# Patient Record
Sex: Male | Born: 1990 | Race: White | Hispanic: No | Marital: Single | Smoking: Current some day smoker
Health system: Southern US, Community
[De-identification: ages and names within clinical notes are randomized; demographics above are authoritative.]

---
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
## 2009-05-30 ENCOUNTER — Ambulatory Visit: Payer: Self-pay | Admitting: Family Medicine

## 2009-05-30 DIAGNOSIS — M546 Pain in thoracic spine: Secondary | ICD-10-CM | POA: Insufficient documentation

## 2010-04-21 NOTE — Assessment & Plan Note (Signed)
Summary: BACK PAIN//SLM   Vital Signs:  Patient profile:   20 year old male Height:      73 inches Weight:      199 pounds BMI:     26.35 Temp:     98.5 degrees F oral Pulse rate:   91 / minute BP sitting:   122 / 82  (left arm) Cuff size:   large  Vitals Entered By: Alfred Levins, CMA (May 30, 2009 4:03 PM) CC: mid left back pain onset today   History of Present Illness: 20 yr old male here for the onset today of stiffness and pain in the middle of the back . He woke up with this. No recent trauma, but he did spend last night on a couch. Ibuprofen helps a little.   Preventive Screening-Counseling & Management  Alcohol-Tobacco     Smoking Status: never  Caffeine-Diet-Exercise     Does Patient Exercise: yes      Drug Use:  marijuana and yes.    Allergies (verified): No Known Drug Allergies  Past History:  Past Medical History: Allergic rhinitis fractured right foot fractured nose  Past Surgical History: Denies surgical history  Family History: Reviewed history and no changes required. Family History Diabetes 1st degree relative  Social History: Reviewed history and no changes required. Single Never Smoked Alcohol use-no Drug use-yes Regular exercise-yes Smoking Status:  never Drug Use:  marijuana, yes Does Patient Exercise:  yes  Review of Systems  The patient denies anorexia, fever, weight loss, weight gain, vision loss, decreased hearing, hoarseness, chest pain, syncope, dyspnea on exertion, peripheral edema, prolonged cough, headaches, hemoptysis, abdominal pain, melena, hematochezia, severe indigestion/heartburn, hematuria, incontinence, genital sores, muscle weakness, suspicious skin lesions, transient blindness, difficulty walking, depression, unusual weight change, abnormal bleeding, enlarged lymph nodes, angioedema, breast masses, and testicular masses.    Physical Exam  General:  Well-developed,well-nourished,in no acute distress;  alert,appropriate and cooperative throughout examination Lungs:  Normal respiratory effort, chest expands symmetrically. Lungs are clear to auscultation, no crackles or wheezes. Heart:  Normal rate and regular rhythm. S1 and S2 normal without gallop, murmur, click, rub or other extra sounds. Msk:  tender along the left side of the back just under the scapula with some spasm. Full ROM   Impression & Recommendations:  Problem # 1:  BACK PAIN, THORACIC REGION (ICD-724.1)  His updated medication list for this problem includes:    Flexeril 10 Mg Tabs (Cyclobenzaprine hcl) .Marland Kitchen... Three times a day as needed spasm  Complete Medication List: 1)  Flexeril 10 Mg Tabs (Cyclobenzaprine hcl) .... Three times a day as needed spasm  Patient Instructions: 1)  This probably came from sleeping on the couch last night. It  should resolve in a few days. Try  heat, Ibuprofen, and Flexeril.  2)  Please schedule a follow-up appointment as needed .  Prescriptions: FLEXERIL 10 MG TABS (CYCLOBENZAPRINE HCL) three times a day as needed spasm  #60 x 2   Entered and Authorized by:   Nelwyn Salisbury MD   Signed by:   Nelwyn Salisbury MD on 05/30/2009   Method used:   Print then Give to Patient   RxID:   [id number]

## 2010-08-07 NOTE — Op Note (Signed)
Corey Harding, Corey Harding          ACCOUNT NO.:  [id number]   MEDICAL RECORD NO.:  [id number]          PATIENT TYPE:  AMB   LOCATION:  [facility]                          FACILITY:  [facility]   PHYSICIAN:  Earvin Hansen L. [place]es[place], M.D.DATE OF BIRTH:  [birth year]   DATE OF PROCEDURE:  07/13/2004  DATE OF DISCHARGE:                                 OPERATIVE REPORT   OPERATIVE REPORT:  This is a 20 year old who was playing basketball and  accidentally was hit in the nose with an elbow, causing severe depressed  right nasal fracture.  When examining the patient, it is obvious that the  right nasion is flattened down and pushed all the way over leftward from the  midline.  He is having difficulty breathing actually on both side.  Intranasal examination reveals the septum to be deviated all the way to the  left side with an airway of approximately 30% patency on the left.   PROCEDURES DONE:  Closed reduction of nasal and septal fractures.   ANESTHESIA:  General.   The patient was taken to the operating room, placed on the operating room  table in the supine position, was given adequate general anesthesia and  intubated orally.  The prep was done to the face with Betadine solution and  walled off with sterile towels and draped so as to make a sterile field.  The midline of the nose was drawn using a marking pen as well as demarcation  of the depressed fracture.  A 1.5% Xylocaine with epinephrine was injected  locally.  A total of 10 cc as well as 4% cocaine packs were placed, a total  of 4 cc.  After waiting an adequate amount of time for vasoconstriction to  take place, an Ash forceps was then used to do a closed reduction of the  nasal septal fracture with an audible pop as well as relocation into the  midline structures.  Also a butter knife was used to lift up the fracture a  little bit more on the right side.  The airways were then dilated with  graduating specula to ensure patency.  After this, the  airways were then  packed with Vaseline gauze, 0.5 inch Steri-Strips were placed and a nasal  splint and drip dressings.  He withstood the procedures very well, was taken  to recovery in excellent condition.      GLT/MEDQ  D:  07/13/2004  T:  07/13/2004  Job:  [id number]

## 2011-11-20 ENCOUNTER — Ambulatory Visit (INDEPENDENT_AMBULATORY_CARE_PROVIDER_SITE_OTHER): Payer: 59 | Admitting: Internal Medicine

## 2011-11-20 ENCOUNTER — Encounter: Payer: Self-pay | Admitting: Internal Medicine

## 2011-11-20 VITALS — BP 100/70 | HR 69 | Temp 97.4°F | Wt 206.0 lb

## 2011-11-20 DIAGNOSIS — R102 Pelvic and perineal pain: Secondary | ICD-10-CM | POA: Insufficient documentation

## 2011-11-20 DIAGNOSIS — R109 Unspecified abdominal pain: Secondary | ICD-10-CM

## 2011-11-20 MED ORDER — DOXYCYCLINE HYCLATE 100 MG PO TABS: 100.0000 mg | ORAL_TABLET | Freq: Two times a day (BID) | ORAL | Status: AC

## 2011-11-20 MED ORDER — CEFTRIAXONE SODIUM 500 MG IJ SOLR
500.0000 mg | Freq: Once | INTRAMUSCULAR | Status: AC
  Administered 2011-11-20: 500 mg via INTRAMUSCULAR

## 2011-11-20 NOTE — Progress Notes (Signed)
  Subjective:    Patient ID: Corey Harding, male    DOB: Jan 28, 1991, 21 y.o.   MRN: [id number]  HPI Recent onset of low back discomfort Has sense of cold and oversensitivity at head of penis Some persistent erection earlier this week---some better (not with stimulation)  No inflammation at glans No discharge  No fever Seen at urgent care at [facility] yesterday---did blood test  Last intercourse was 1 week ago Single sexual partner for 2 months---using condoms May have had 4 partners in the past year--always uses condoms  No current outpatient prescriptions on file prior to visit.    No Known Allergies  No past medical history on file.  No past surgical history on file.  No family history on file.  History   Social History  . Marital Status: Single    Spouse Name: N/A    Number of Children: N/A  . Years of Education: N/A   Occupational History  . Marketing internship     [employer]   Social History Main Topics  . Smoking status: Never Smoker   . Smokeless tobacco: Never Used  . Alcohol Use: Yes  . Drug Use: Yes    Special: Marijuana  . Sexually Active: Not on file   Other Topics Concern  . Not on file   Social History Narrative  . No narrative on file   Review of Systems Does have some urinary urgency but no dysuria Some rectal urgency yesterday. Good BM today Appetite is off some    Objective:   Physical Exam  Constitutional: He appears well-developed and well-nourished. No distress.  Abdominal: Soft. There is no tenderness.  Genitourinary:       No urethral inflammation or discharge No scrotal swelling or inflammation or tenderness Rectal exam shows non boggy prostate with mild tenderness          Assessment & Plan:

## 2011-11-20 NOTE — Assessment & Plan Note (Signed)
Most likely diagnosis is prostatitis He has had only protected sex but still need to consider STD Will treat with rocephin Doxycycline for 10 days

## 2012-10-02 ENCOUNTER — Ambulatory Visit (INDEPENDENT_AMBULATORY_CARE_PROVIDER_SITE_OTHER): Payer: 59 | Admitting: Family Medicine

## 2012-10-02 VITALS — BP 124/76 | HR 74 | Temp 97.6°F | Wt 211.0 lb

## 2012-10-02 DIAGNOSIS — Z113 Encounter for screening for infections with a predominantly sexual mode of transmission: Secondary | ICD-10-CM

## 2012-10-02 NOTE — Progress Notes (Signed)
  Subjective:    Patient ID: Corey Harding, male    DOB: 22-Feb-1991, 22 y.o.   MRN: [id number]  HPI Acute visit Patient seen with some prominent veins underneath penis off and on past couple months. Mostly noted following intercourse and after erections. He has had multiple sexual partners within the past year. Mostly barrier protection but not consistently Denies history of STD. No recent penile rashes. No discharge. No dysuria. No recent appetite or weight changes. He states he's had previous hepatitis B vaccination  No past medical history on file. No past surgical history on file.  reports that he has never smoked. He has never used smokeless tobacco. He reports that  drinks alcohol. He reports that he uses illicit drugs (Marijuana). family history is not on file. No Known Allergies     Review of Systems  Constitutional: Negative for fever and chills.  Respiratory: Negative for cough and shortness of breath.   Genitourinary: Negative for dysuria, hematuria, discharge and genital sores.  Skin: Negative for rash.  Hematological: Negative for adenopathy.       Objective:   Physical Exam  Constitutional: He appears well-developed and well-nourished.  Cardiovascular: Normal rate and regular rhythm.   Pulmonary/Chest: Effort normal and breath sounds normal. No respiratory distress. He has no wheezes. He has no rales.  Genitourinary: Penis normal.  No testicle mass. No hernia. Somewhat prominent veins underneath side of penis otherwise normal exam. No skin lesions  Skin: No rash noted.          Assessment & Plan:  #1 STD screening. We discussed the importance of barrier protection. Obtain HIV, RPR, and urine for GC and Chlamydia screen. Otherwise exam is normal. He has some minimally prominent veins underneath side of penis and reassurance is given

## 2012-10-03 LAB — GC/CHLAMYDIA PROBE AMP, URINE: GC Probe Amp, Urine: NEGATIVE

## 2012-10-03 LAB — HIV ANTIBODY (ROUTINE TESTING W REFLEX): HIV: NONREACTIVE

## 2015-02-24 ENCOUNTER — Telehealth: Payer: Self-pay | Admitting: Family Medicine

## 2015-02-24 NOTE — Telephone Encounter (Signed)
Pt's mother Bev Alfredia FergusonCleveland states pt needs to be seen for cpx within the next 2 weeks.  Requesting to have pt worked in.  Please advise if ok to work pt in for cpx within the next 2 weeks.

## 2015-02-25 NOTE — Telephone Encounter (Signed)
Yes we can work him in for a cpx  

## 2015-02-26 NOTE — Telephone Encounter (Signed)
Appt scheduled 02/28/15 @ 11:15am, asked to arrive at 11am. Pt's mother notified of appt time.

## 2015-02-28 ENCOUNTER — Encounter: Payer: Self-pay | Admitting: Family Medicine

## 2015-02-28 ENCOUNTER — Ambulatory Visit (INDEPENDENT_AMBULATORY_CARE_PROVIDER_SITE_OTHER): Payer: 59 | Admitting: Family Medicine

## 2015-02-28 VITALS — BP 136/73 | HR 59 | Temp 98.6°F | Ht 73.0 in | Wt 220.0 lb

## 2015-02-28 DIAGNOSIS — Z0000 Encounter for general adult medical examination without abnormal findings: Secondary | ICD-10-CM

## 2015-02-28 NOTE — Progress Notes (Signed)
Pre visit review using our clinic review tool, if applicable. No additional management support is needed unless otherwise documented below in the visit note. 

## 2015-02-28 NOTE — Progress Notes (Signed)
   Subjective:    Patient ID: Corey Harding, male    DOB: 06/11/1990, 24 y.o.   MRN: [id number]  HPI 24 yr old male for a cpx. He feels well and has no concerns. He notes that [place] 03[place]19-[place] for [employer], and he wants to make sure he is healthy before he goes.    Review of Systems  Constitutional: Negative.   HENT: Negative.   Eyes: Negative.   Respiratory: Negative.   Cardiovascular: Negative.   Gastrointestinal: Negative.   Genitourinary: Negative.   Musculoskeletal: Negative.   Skin: Negative.   Neurological: Negative.   Psychiatric/Behavioral: Negative.        Objective:   Physical Exam  Constitutional: He is oriented to person, place, and time. He appears well-developed and well-nourished. No distress.  HENT:  Head: Normocephalic and atraumatic.  Right Ear: External ear normal.  Left Ear: External ear normal.  Nose: Nose normal.  Mouth/Throat: Oropharynx is clear and moist. No oropharyngeal exudate.  Eyes: Conjunctivae and EOM are normal. Pupils are equal, round, and reactive to light. Right eye exhibits no discharge. Left eye exhibits no discharge. No scleral icterus.  Neck: Neck supple. No JVD present. No tracheal deviation present. No thyromegaly present.  Cardiovascular: Normal rate, regular rhythm, normal heart sounds and intact distal pulses.  Exam reveals no gallop and no friction rub.   No murmur heard. Pulmonary/Chest: Effort normal and breath sounds normal. No respiratory distress. He has no wheezes. He has no rales. He exhibits no tenderness.  Abdominal: Soft. Bowel sounds are normal. He exhibits no distension and no mass. There is no tenderness. There is no rebound and no guarding.  Genitourinary: Penis normal. No penile tenderness.  Musculoskeletal: Normal range of motion. He exhibits no edema or tenderness.  Lymphadenopathy:    He has no cervical adenopathy.  Neurological: He is alert and  oriented to person, place, and time. He has normal reflexes. No cranial nerve deficit. He exhibits normal muscle tone. Coordination normal.  Skin: Skin is warm and dry. No rash noted. He is not diaphoretic. No erythema. No pallor.  Psychiatric: He has a normal mood and affect. His behavior is normal. Judgment and thought content normal.          Assessment & Plan:  Well exam. We discussed diet and exercise. He will return next week for fasting labs.

## 2015-03-04 ENCOUNTER — Other Ambulatory Visit (INDEPENDENT_AMBULATORY_CARE_PROVIDER_SITE_OTHER): Payer: 59

## 2015-03-04 DIAGNOSIS — Z0000 Encounter for general adult medical examination without abnormal findings: Secondary | ICD-10-CM

## 2015-03-04 LAB — HEPATIC FUNCTION PANEL
ALBUMIN: 4.6 g/dL (ref 3.5–5.2)
ALT: 28 U/L (ref 0–53)
AST: 19 U/L (ref 0–37)
Alkaline Phosphatase: 83 U/L (ref 39–117)
Bilirubin, Direct: 0.1 mg/dL (ref 0.0–0.3)
Total Bilirubin: 0.5 mg/dL (ref 0.2–1.2)
Total Protein: 7.3 g/dL (ref 6.0–8.3)

## 2015-03-04 LAB — LIPID PANEL
CHOLESTEROL: 193 mg/dL (ref 0–200)
HDL: 33 mg/dL — AB (ref >39.00)
LDL CALC: 136 mg/dL — AB (ref 0–99)
NonHDL: 160.4
TRIGLYCERIDES: 123 mg/dL (ref 0.0–149.0)
Total CHOL/HDL Ratio: 6
VLDL: 24.6 mg/dL (ref 0.0–40.0)

## 2015-03-04 LAB — BASIC METABOLIC PANEL
BUN: 15 mg/dL (ref 6–23)
CO2: 29 mEq/L (ref 19–32)
CREATININE: 1.21 mg/dL (ref 0.40–1.50)
Calcium: 9.9 mg/dL (ref 8.4–10.5)
Chloride: 104 mEq/L (ref 96–112)
GFR: 76.81 mL/min (ref >60.00)
GLUCOSE: 86 mg/dL (ref 70–99)
POTASSIUM: 4.2 meq/L (ref 3.5–5.1)
Sodium: 140 mEq/L (ref 135–145)

## 2015-03-04 LAB — CBC WITH DIFFERENTIAL/PLATELET
BASOS ABS: 0 10*3/uL (ref 0.0–0.1)
Basophils Relative: 0.8 % (ref 0.0–3.0)
Eosinophils Absolute: 0.3 10*3/uL (ref 0.0–0.7)
Eosinophils Relative: 4.2 % (ref 0.0–5.0)
HCT: 47.3 % (ref 39.0–52.0)
Hemoglobin: 15.6 g/dL (ref 13.0–17.0)
LYMPHS ABS: 3 10*3/uL (ref 0.7–4.0)
Lymphocytes Relative: 49.1 % — ABNORMAL HIGH (ref 12.0–46.0)
MCHC: 33 g/dL (ref 30.0–36.0)
MCV: 96.3 fl (ref 78.0–100.0)
MONO ABS: 0.5 10*3/uL (ref 0.1–1.0)
MONOS PCT: 7.9 % (ref 3.0–12.0)
NEUTROS PCT: 38 % — AB (ref 43.0–77.0)
Neutro Abs: 2.3 10*3/uL (ref 1.4–7.7)
Platelets: 189 10*3/uL (ref 150.0–400.0)
RBC: 4.92 Mil/uL (ref 4.22–5.81)
RDW: 13.3 % (ref 11.5–15.5)
WBC: 6.2 10*3/uL (ref 4.0–10.5)

## 2015-03-04 LAB — POCT URINALYSIS DIPSTICK
Bilirubin, UA: NEGATIVE
Blood, UA: NEGATIVE
Glucose, UA: NEGATIVE
Ketones, UA: NEGATIVE
LEUKOCYTES UA: NEGATIVE
NITRITE UA: NEGATIVE
PH UA: 6.5
Spec Grav, UA: 1.025
Urobilinogen, UA: 0.2

## 2015-03-04 LAB — TSH: TSH: 1.61 u[IU]/mL (ref 0.35–4.50)

## 2022-11-30 ENCOUNTER — Ambulatory Visit (INDEPENDENT_AMBULATORY_CARE_PROVIDER_SITE_OTHER): Payer: Self-pay | Admitting: Podiatry

## 2022-11-30 ENCOUNTER — Encounter: Payer: Self-pay | Admitting: Podiatry

## 2022-11-30 DIAGNOSIS — B351 Tinea unguium: Secondary | ICD-10-CM

## 2022-11-30 MED ORDER — TERBINAFINE HCL 250 MG PO TABS: 250.0000 mg | ORAL_TABLET | Freq: Every day | ORAL | 0 refills | Status: AC

## 2022-12-05 NOTE — Progress Notes (Signed)
Subjective:  Patient ID: Corey Harding, male    DOB: 1990-09-28,  MRN: [id number]  Chief Complaint  Patient presents with   Nail Problem    Patient is here for toenail fungus great right toe(black)    32 y.o. male presents with the above complaint. History confirmed with patient.  Has been there for some time and worsening  Objective:  Physical Exam: warm, good capillary refill, no trophic changes or ulcerative lesions, normal DP and PT pulses, normal sensory exam, and onychomycosis.       Assessment:   1. Onychomycosis      Plan:  Patient was evaluated and treated and all questions answered.   Onychomycosis -Educated on etiology of nail fungus. -Discussed oral topical and laser therapy and risks and benefits of each -eRx for oral terbinafine #90. Educated on risks and benefits of the medication. -Photographs taken   Return in about 4 months (around 04/01/2023) for follow up after nail fungus treatment.

## 2023-01-17 ENCOUNTER — Ambulatory Visit: Payer: Self-pay | Admitting: Physician Assistant

## 2023-01-17 ENCOUNTER — Ambulatory Visit: Payer: Self-pay | Admitting: Family Medicine

## 2023-01-18 ENCOUNTER — Ambulatory Visit: Payer: Self-pay | Admitting: Family Medicine

## 2023-01-18 ENCOUNTER — Ambulatory Visit: Payer: Self-pay | Admitting: Physician Assistant

## 2023-02-23 ENCOUNTER — Other Ambulatory Visit (INDEPENDENT_AMBULATORY_CARE_PROVIDER_SITE_OTHER): Payer: Self-pay

## 2023-02-23 ENCOUNTER — Encounter: Payer: Self-pay | Admitting: Orthopedic Surgery

## 2023-02-23 ENCOUNTER — Ambulatory Visit (INDEPENDENT_AMBULATORY_CARE_PROVIDER_SITE_OTHER): Payer: Self-pay | Admitting: Orthopedic Surgery

## 2023-02-23 DIAGNOSIS — M25561 Pain in right knee: Secondary | ICD-10-CM

## 2023-02-23 NOTE — Progress Notes (Signed)
Office Visit Note   Patient: Corey Harding           Date of Birth: 1991/02/17           MRN: [id number] Visit Date: 02/23/2023 Requested by: Nelwyn Salisbury, MD [address] [place],  [place] [address] PCP: Nelwyn Salisbury, MD  Subjective: Chief Complaint  Patient presents with   Right Knee - Pain    HPI: Corey Harding is a 32 y.o. male who presents to the office reporting right knee pain of 2 months duration.  Patient states that he was jogging on the street and had to get up onto grass and he "rolled the knee" and then went back to running on the road.  Had a buckling type injury at that time with subsequent swelling the next day.  He denies any subsequent instability but does report some medial joint line tenderness.  Takes ibuprofen with some relief.  He has continued to do a home exercise program to focus on rehabilitative type exercises in the knee but his pain has persisted and he has not jogged since the injury.  He does office work.  It is hard for him to go down his steps and to weight-bear on that right leg.  He does want to run again.  No prior surgery to the knee..                ROS: All systems reviewed are negative as they relate to the chief complaint within the history of present illness.  Patient denies fevers or chills.  Assessment & Plan: Visit Diagnoses:  1. Right knee pain, unspecified chronicity     Plan: Impression is possible meniscal tear based on history.  Does have a little medial joint line tenderness today with stable collateral cruciate ligaments.  Based on failure of conservative management as well as excellent quad and hamstring strength at this time with full range of motion MRI is indicated prior to return to running.  Concern at this time would be for medial meniscal pathology.  Patella feels stable.  Extensor mechanism intact and nontender.  MRI right knee indicated to evaluate for medial meniscal tear with follow-up after that  study  Follow-Up Instructions: No follow-ups on file.   Orders:  Orders Placed This Encounter  Procedures   XR KNEE 3 VIEW RIGHT   MR Knee Right w/o contrast   No orders of the defined types were placed in this encounter.     Procedures: No procedures performed   Clinical Data: No additional findings.  Objective: Vital Signs: There were no vitals taken for this visit.  Physical Exam:  Constitutional: Patient appears well-developed HEENT:  Head: Normocephalic Eyes:EOM are normal Neck: Normal range of motion Cardiovascular: Normal rate Pulmonary/chest: Effort normal Neurologic: Patient is alert Skin: Skin is warm Psychiatric: Patient has normal mood and affect  Ortho Exam: Ortho exam demonstrates normal gait alignment.  Pedal pulses palpable.  Range of motion full in that right knee.  Does have medial greater than lateral joint line tenderness with intact extensor mechanism and positive McMurray compression testing.  Collateral crucial ligaments are stable.  No effusion in the knee.  Specialty Comments:  No specialty comments available.  Imaging: XR KNEE 3 VIEW RIGHT  Result Date: 02/23/2023 AP lateral merchant radiographs right knee reviewed.  Normal alignment no fracture no arthritis.    PMFS History: Patient Active Problem List   Diagnosis Date Noted   Pelvic pain in male  11/20/2011   BACK PAIN, THORACIC REGION 05/30/2009   Allergic rhinitis 11/28/2006   History reviewed. No pertinent past medical history.  History reviewed. No pertinent family history.  History reviewed. No pertinent surgical history. Social History   Occupational History   Occupation: Hydrologist    Comment: Insurance account manager  Tobacco Use   Smoking status: Some Days   Smokeless tobacco: Never  Substance and Sexual Activity   Alcohol use: Yes    Alcohol/week: 0.0 standard drinks of alcohol    Comment: occ   Drug use: Yes    Types: Marijuana    Comment: occ   Sexual  activity: Not on file

## 2023-03-05 ENCOUNTER — Other Ambulatory Visit: Payer: Self-pay

## 2023-03-08 ENCOUNTER — Ambulatory Visit: Payer: Self-pay

## 2023-03-08 DIAGNOSIS — M25561 Pain in right knee: Secondary | ICD-10-CM

## 2023-03-10 ENCOUNTER — Ambulatory Visit (INDEPENDENT_AMBULATORY_CARE_PROVIDER_SITE_OTHER): Payer: Self-pay | Admitting: Orthopedic Surgery

## 2023-03-10 ENCOUNTER — Encounter: Payer: Self-pay | Admitting: Orthopedic Surgery

## 2023-03-10 DIAGNOSIS — M25561 Pain in right knee: Secondary | ICD-10-CM

## 2023-03-10 NOTE — Progress Notes (Signed)
   Office Visit Note   Patient: Corey Harding           Date of Birth: 08/21/1990           MRN: [id number] Visit Date: 03/10/2023 Requested by: Nelwyn Salisbury, MD [address] [place],  [place] [address] PCP: Nelwyn Salisbury, MD  Subjective: Chief Complaint  Patient presents with   Other     Review MRI right knee     HPI: Corey Harding is a 32 y.o. male who presents to the office reporting right knee pain.  Here for follow-up of MRI scan.  Patient had an injury when he was jogging and had to sidestep to avoid an obstacle.  MRI is reviewed by myself.  Report not available; however, no obvious operative pathology is present..                ROS: All systems reviewed are negative as they relate to the chief complaint within the history of present illness.  Patient denies fevers or chills.  Assessment & Plan: Visit Diagnoses:  1. Right knee pain, unspecified chronicity     Plan: Impression is structurally normal MRI scan of the knee with the exception of a small area of edema in the trochlea.  Plan is resume activity as tolerated including running on flat surfaces.  If symptoms recur then we will plan for injection.  Follow-up as needed  Follow-Up Instructions: No follow-ups on file.   Orders:  No orders of the defined types were placed in this encounter.  No orders of the defined types were placed in this encounter.     Procedures: No procedures performed   Clinical Data: No additional findings.  Objective: Vital Signs: There were no vitals taken for this visit.  Physical Exam:  Constitutional: Patient appears well-developed HEENT:  Head: Normocephalic Eyes:EOM are normal Neck: Normal range of motion Cardiovascular: Normal rate Pulmonary/chest: Effort normal Neurologic: Patient is alert Skin: Skin is warm Psychiatric: Patient has normal mood and affect  Ortho Exam: Ortho exam demonstrates full active and passive range of motion of the right  knee.  No effusion.  No joint line tenderness.  No real patellofemoral crepitus.  Extensor mechanism intact.  Gait normal.  Quad and hamstring strength excellent.  Specialty Comments:  No specialty comments available.  Imaging: No results found.   PMFS History: Patient Active Problem List   Diagnosis Date Noted   Pelvic pain in male 11/20/2011   BACK PAIN, THORACIC REGION 05/30/2009   Allergic rhinitis 11/28/2006   History reviewed. No pertinent past medical history.  History reviewed. No pertinent family history.  History reviewed. No pertinent surgical history. Social History   Occupational History   Occupation: Hydrologist    Comment: Insurance account manager  Tobacco Use   Smoking status: Some Days   Smokeless tobacco: Never  Substance and Sexual Activity   Alcohol use: Yes    Alcohol/week: 0.0 standard drinks of alcohol    Comment: occ   Drug use: Yes    Types: Marijuana    Comment: occ   Sexual activity: Not on file

## 2023-03-12 ENCOUNTER — Other Ambulatory Visit: Payer: Self-pay

## 2023-03-14 ENCOUNTER — Ambulatory Visit: Payer: Self-pay | Admitting: Orthopedic Surgery

## 2023-03-18 ENCOUNTER — Encounter: Payer: Self-pay | Admitting: Family Medicine

## 2023-03-18 ENCOUNTER — Ambulatory Visit (INDEPENDENT_AMBULATORY_CARE_PROVIDER_SITE_OTHER): Payer: Self-pay | Admitting: Family Medicine

## 2023-03-18 VITALS — BP 110/78 | HR 83 | Ht 73.0 in | Wt 253.0 lb

## 2023-03-18 DIAGNOSIS — M25561 Pain in right knee: Secondary | ICD-10-CM | POA: Insufficient documentation

## 2023-03-18 DIAGNOSIS — G8929 Other chronic pain: Secondary | ICD-10-CM

## 2023-03-18 MED ORDER — MELOXICAM 15 MG PO TABS: 15.0000 mg | ORAL_TABLET | Freq: Every day | ORAL | 0 refills | Status: AC

## 2023-03-18 NOTE — Progress Notes (Signed)
[name] [address] [place] [address] Phone: [phone] Subjective:   Bruce Donath, am serving as a scribe for Dr. Antoine Primas.  I'm seeing this patient by the request  of:  Nelwyn Salisbury, MD  CC: Right knee pain  [id number]  Corey Harding is a 32 y.o. male coming in with complaint of R knee pain. Patient was jogging 2 months ago and twisted knee. Patient noticed swelling. Had a hard time walking initially. Has not  been running since. Pain is located in anterior aspect. Saw Dr. August Saucer who ordered MRI.   Patient has tried to play basketball since injury but said he tried to cut and knee felt unstable so he stopped playing.   MRI R knee 03/08/2023 IMPRESSION: 1. 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 2. Intact menisci. Intact cruciate and collateral ligaments. 3. Minimal distal quadriceps insertional tendinosis.    No past medical history on file. No past surgical history on file. Social History   Socioeconomic History   Marital status: Single    Spouse name: Not on file   Number of children: Not on file   Years of education: Not on file   Highest education level: Not on file  Occupational History   Occupation: Marketing internship    Comment: [employer]  Tobacco Use   Smoking status: Some Days   Smokeless tobacco: Never  Substance and Sexual Activity   Alcohol use: Yes    Alcohol/week: 0.0 standard drinks of alcohol    Comment: occ   Drug use: Yes    Types: Marijuana    Comment: occ   Sexual activity: Not on file  Other Topics Concern   Not on file  Social History Narrative   Not on file   Social Drivers of Health   Financial Resource Strain: Not on file  Food Insecurity: Not on file  Transportation Needs: Not on file  Physical Activity: Not on file  Stress: Not on file  Social Connections: Not on file   No  Known Allergies No family history on file.     Current Outpatient Medications (Analgesics):    meloxicam (MOBIC) 15 MG tablet, Take 1 tablet (15 mg total) by mouth daily.     Reviewed prior external information including notes and imaging from  primary care provider As well as notes that were available from care everywhere and other healthcare systems.  Past medical history, social, surgical and family history all reviewed in electronic medical record.  No pertanent information unless stated regarding to the chief complaint.   Review of Systems:  No headache, visual changes, nausea, vomiting, diarrhea, constipation, dizziness, abdominal pain, skin rash, fevers, chills, night sweats, weight loss, swollen lymph nodes, body aches, joint swelling, chest pain, shortness of breath, mood changes. POSITIVE muscle aches  Objective  Blood pressure 110/78, pulse 83, height 6\' 1"  (1.854 m), weight 253 lb (114.8 kg), SpO2 98%.   General: No apparent distress alert and oriented x3 mood and affect normal, dressed appropriately.  HEENT: Pupils equal, extraocular movements intact  Respiratory: Patient's speak in full sentences and does not appear short of breath  Cardiovascular: No lower extremity edema, non tender, no erythema  Right knee exam shows the patient does not have any swelling but does have a mild patella alta noted.  Some mild tenderness with patellar grind test noted.  Limited muscular skeletal ultrasound was performed and  interpreted by Antoine Primas, M  Limited ultrasound the patient patellofemoral shows there is a cortical irregularity on the deep superior aspect at the patella.  Likely a healing cortical defect noted in this area.  Consistent with some of the MRI findings. Impression: Healing trochlea injury    Impression and Recommendations:     The above documentation has been reviewed and is accurate and complete Judi Saa, DO

## 2023-03-18 NOTE — Assessment & Plan Note (Signed)
Patient's MRI does show that there likely was a small cortical defect noted and potentially some cartilage injury to the superior and trochlear notch.  I believe that this was secondary to a potential subluxation of the kneecap.  Given some home exercises for the VMO.  Discussed that there is some patella alta and discussed a patella strap that would be helpful as well.  Discussed icing regimen and home exercises.  Discussed eccentric exercises for the quadricep tendon.  Meloxicam prescribed with patient going back to [place].  Follow-up again in the coming months if patient is returning.

## 2023-03-18 NOTE — Patient Instructions (Addendum)
Patellar strap Shorter Stride length Exercises Ok to jog for next 2 weeks No basketball until week 3 Meloxicam 15mg  to take in 5 day bursts when needed
